# Patient Record
Sex: Male | Born: 2000 | Race: Black or African American | Hispanic: No | Marital: Single | Smoking: Never smoker
Health system: Southern US, Community
[De-identification: ages and names within clinical notes are randomized; demographics above are authoritative.]

## PROBLEM LIST (undated history)

## (undated) ENCOUNTER — Ambulatory Visit (HOSPITAL_COMMUNITY): Admission: EM | Source: Home / Self Care

---
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
## 2023-07-28 ENCOUNTER — Ambulatory Visit (HOSPITAL_COMMUNITY)
Admission: EM | Admit: 2023-07-28 | Discharge: 2023-07-28 | Disposition: A | Discharge status: Home / Self Care | Payer: Commercial Managed Care - PPO | Attending: Family Medicine | Admitting: Family Medicine

## 2023-07-28 ENCOUNTER — Encounter (HOSPITAL_COMMUNITY): Payer: Self-pay

## 2023-07-28 DIAGNOSIS — R1030 Lower abdominal pain, unspecified: Secondary | ICD-10-CM | POA: Diagnosis not present
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
## 2023-08-26 ENCOUNTER — Ambulatory Visit (INDEPENDENT_AMBULATORY_CARE_PROVIDER_SITE_OTHER): Payer: Commercial Managed Care - PPO | Admitting: Family Medicine

## 2023-08-26 ENCOUNTER — Encounter: Payer: Self-pay | Admitting: Family Medicine

## 2023-08-26 VITALS — BP 122/84 | HR 92 | Temp 98.5°F | Ht 76.0 in | Wt 257.4 lb

## 2023-08-26 DIAGNOSIS — N182 Chronic kidney disease, stage 2 (mild): Secondary | ICD-10-CM

## 2023-08-26 DIAGNOSIS — Z0000 Encounter for general adult medical examination without abnormal findings: Secondary | ICD-10-CM | POA: Diagnosis not present

## 2023-08-26 DIAGNOSIS — Z23 Encounter for immunization: Secondary | ICD-10-CM | POA: Insufficient documentation

## 2023-08-26 DIAGNOSIS — Z0001 Encounter for general adult medical examination with abnormal findings: Secondary | ICD-10-CM

## 2023-08-26 DIAGNOSIS — K760 Fatty (change of) liver, not elsewhere classified: Secondary | ICD-10-CM

## 2023-08-26 NOTE — Progress Notes (Addendum)
 Complete physical exam  Assessment & Plan:    Routine Health Maintenance and Physical Exam Discussed health benefits of physical activity, and encouraged him to engage in regular exercise appropriate for his age and condition.  Preventative health care -     CBC with Differential/Platelet -     Lipid panel -     TSH -     COMPLETE METABOLIC PANEL WITHOUT GFR  Need for diphtheria-tetanus-pertussis (Tdap) vaccine -     Tdap vaccine greater than or equal to 7yo IM  Need for influenza vaccination -     Flu vaccine trivalent PF, 6mos and older(Flulaval,Afluria,Fluarix,Fluzone)  Fatty liver  CKD (chronic kidney disease) stage 2, GFR 60-89 ml/min   Addendum- pt did see Nephrology Dr. Carrolyn Clan re CKD stage 2, proteinuria, microcytosis and fatty liver- pt had renal ultrasound which showed mild bilateral hydronephrosis- per Nephrology patient will see Urology re this. Hemoglobin Electrophoresis was also ordered due to microcytosis.  Nephrology also recommended vaccinations for Hep A and Hep B in the future.  Return if symptoms worsen or fail to improve.        Subjective:  Patient ID: Joseph Lara, male    DOB: 20-Jul-2000  Age: 23 y.o. MRN: [id number] Chief Complaint  Patient presents with   Annual Exam    Joseph Lara is a 23 y.o. male who presents today for a complete physical exam.  Patient states he has gained some weight in the last couple years.  Patient has been eating a lot of fast foods.  Patient has not had a physical in a long time. Tetanus due soon Flu shot- not yet STI risk- does not have concerns.  Health Maintenance  Topic Date Due   HPV Vaccine (1 - Male 3-dose series) Never done   HIV Screening  Never done   COVID-19 Vaccine (1 - 2024-25 season) Never done   Flu Shot  02/10/2024   DTaP/Tdap/Td vaccine (8 - Td or Tdap) 08/25/2033   Pneumococcal Vaccination  Completed   Hepatitis C Screening  Completed   Meningitis B Vaccine  Completed    Most recent  fall risk assessment:    08/26/2023    8:18 AM  Fall Risk   Falls in the past year? 0  Number falls in past yr: 0  Injury with Fall? 0     Most recent depression screenings:    08/26/2023    8:18 AM 10/15/2019    2:17 PM  PHQ 2/9 Scores  PHQ - 2 Score 0 0  PHQ- 9 Score  0      The ASCVD Risk score (Arnett DK, et al., 2019) failed to calculate for the following reasons:   The 2019 ASCVD risk score is only valid for ages 79 to 20  History reviewed. No pertinent surgical history. Social History   Tobacco Use   Smoking status: Never   Smokeless tobacco: Never  Vaping Use   Vaping status: Never Used  Substance Use Topics   Alcohol use: No    Alcohol/week: 0.0 standard drinks of alcohol   Drug use: Never   Social History   Socioeconomic History   Marital status: Single    Spouse name: Not on file   Number of children: Not on file   Years of education: Not on file   Highest education level: Not on file  Occupational History   Not on file  Tobacco Use   Smoking status: Never   Smokeless tobacco: Never  Vaping Use  Vaping status: Never Used  Substance and Sexual Activity   Alcohol use: No    Alcohol/week: 0.0 standard drinks of alcohol   Drug use: Never   Sexual activity: Yes    Partners: Female  Other Topics Concern   Not on file  Social History Narrative   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 Pt eats out about 50% of the time. Weight lifting and cardio Engineer, materials)   Social Drivers of Health   Financial Resource Strain: Not on file  Food Insecurity: Not on file  Transportation Needs: Not on file  Physical Activity: Not on file  Stress: Not on file  Social Connections: Not on file  Intimate Partner Violence: Not on file   Family History  Problem Relation Age of Onset   Breast cancer Paternal Aunt    Allergies  Allergen Reactions   Penicillins Rash     Patient Care Team: Amadeo June, MD as PCP -  General (Family Medicine)   No outpatient medications prior to visit.   No facility-administered medications prior to visit.    ROS     Objective:    BP 122/84   Pulse 92   Temp 98.5 F (36.9 C)   Ht 6\' 4"  (1.93 m)   Wt 257 lb 6 oz (116.7 kg)   SpO2 99%   BMI 31.33 kg/m  BP Readings from Last 3 Encounters:  08/26/23 122/84  07/28/23 124/78  01/01/21 (!) 132/82 (82%, Z = 0.92 /  87%, Z = 1.13)*   *BP percentiles are based on the 2017 AAP Clinical Practice Guideline for boys   Wt Readings from Last 3 Encounters:  08/26/23 257 lb 6 oz (116.7 kg)  07/28/23 240 lb (108.9 kg)  01/01/21 194 lb 6.4 oz (88.2 kg) (93%, Z= 1.48)*   * Growth percentiles are based on CDC (Boys, 2-20 Years) data.    Physical Exam Vitals and nursing note reviewed.  Constitutional:      Appearance: Normal appearance.  HENT:     Head: Normocephalic.     Right Ear: Tympanic membrane, ear canal and external ear normal.     Left Ear: Tympanic membrane, ear canal and external ear normal.     Nose: Nose normal.     Mouth/Throat:     Mouth: Mucous membranes are moist.     Pharynx: Oropharynx is clear.  Eyes:     Extraocular Movements: Extraocular movements intact.     Conjunctiva/sclera: Conjunctivae normal.     Pupils: Pupils are equal, round, and reactive to light.  Neck:     Thyroid: No thyroid mass, thyromegaly or thyroid tenderness.  Cardiovascular:     Rate and Rhythm: Normal rate and regular rhythm.     Heart sounds: Normal heart sounds.  Pulmonary:     Effort: Pulmonary effort is normal.     Breath sounds: Normal breath sounds.  Abdominal:     General: Bowel sounds are normal.     Palpations: Abdomen is soft.     Tenderness: There is no abdominal tenderness. There is no guarding.     Hernia: No hernia is present. There is no hernia in the left inguinal area or right inguinal area.  Genitourinary:    Penis: Normal. No tenderness, discharge or lesions.      Testes: Normal.      Epididymis:     Right: Normal.     Left: Normal.  Musculoskeletal:  General: No tenderness. Normal range of motion.     Cervical back: Normal range of motion.     Right lower leg: No edema.     Left lower leg: No edema.  Skin:    General: Skin is warm and dry.  Neurological:     General: No focal deficit present.     Mental Status: He is alert and oriented to person, place, and time. Mental status is at baseline.     Cranial Nerves: No cranial nerve deficit.     Sensory: No sensory deficit.     Motor: No weakness.     Coordination: Coordination normal.     Gait: Gait normal.     Deep Tendon Reflexes: Reflexes normal.  Psychiatric:        Mood and Affect: Mood normal.        Behavior: Behavior normal.        Thought Content: Thought content normal.        Judgment: Judgment normal.      Results for orders placed or performed in visit on 08/26/23  CBC with Differential/Platelet  Result Value Ref Range   WBC 6.3 3.8 - 10.8 Thousand/uL   RBC 6.49 (H) 4.20 - 5.80 Million/uL   Hemoglobin 14.1 13.2 - 17.1 g/dL   HCT 40.9 81.1 - 91.4 %   MCV 74.3 (L) 80.0 - 100.0 fL   MCH 21.7 (L) 27.0 - 33.0 pg   MCHC 29.3 (L) 32.0 - 36.0 g/dL   RDW 78.2 95.6 - 21.3 %   Platelets 253 140 - 400 Thousand/uL   MPV 10.1 7.5 - 12.5 fL   Neutro Abs 3,849 1,500 - 7,800 cells/uL   Absolute Lymphocytes 1,449 850 - 3,900 cells/uL   Absolute Monocytes 832 200 - 950 cells/uL   Eosinophils Absolute 139 15 - 500 cells/uL   Basophils Absolute 32 0 - 200 cells/uL   Neutrophils Relative % 61.1 %   Total Lymphocyte 23.0 %   Monocytes Relative 13.2 %   Eosinophils Relative 2.2 %   Basophils Relative 0.5 %  Lipid panel  Result Value Ref Range   Cholesterol 117 <200 mg/dL   HDL 48 > OR = 40 mg/dL   Triglycerides 64 <086 mg/dL   LDL Cholesterol (Calc) 55 mg/dL (calc)   Total CHOL/HDL Ratio 2.4 <5.0 (calc)   Non-HDL Cholesterol (Calc) 69 <578 mg/dL (calc)  TSH  Result Value Ref Range   TSH 2.54  0.40 - 4.50 mIU/L  COMPLETE METABOLIC PANEL WITH GFR  Result Value Ref Range   Glucose, Bld 97 65 - 99 mg/dL   BUN 16 7 - 25 mg/dL   Creat 4.69 (H) 6.29 - 1.24 mg/dL   eGFR 79 > OR = 60 BM/WUX/3.24M0   BUN/Creatinine Ratio 12 6 - 22 (calc)   Sodium 139 135 - 146 mmol/L   Potassium 4.3 3.5 - 5.3 mmol/L   Chloride 103 98 - 110 mmol/L   CO2 24 20 - 32 mmol/L   Calcium 9.4 8.6 - 10.3 mg/dL   Total Protein 7.2 6.1 - 8.1 g/dL   Albumin 4.7 3.6 - 5.1 g/dL   Globulin 2.5 1.9 - 3.7 g/dL (calc)   AG Ratio 1.9 1.0 - 2.5 (calc)   Total Bilirubin 0.7 0.2 - 1.2 mg/dL   Alkaline phosphatase (APISO) 64 36 - 130 U/L   AST 43 (H) 10 - 40 U/L   ALT 52 (H) 9 - 46 U/L  Amadeo June, MD

## 2023-08-27 LAB — CBC WITH DIFFERENTIAL/PLATELET
Absolute Lymphocytes: 1449 {cells}/uL (ref 850–3900)
Absolute Monocytes: 832 {cells}/uL (ref 200–950)
Basophils Absolute: 32 {cells}/uL (ref 0–200)
Basophils Relative: 0.5 %
Eosinophils Absolute: 139 {cells}/uL (ref 15–500)
Eosinophils Relative: 2.2 %
HCT: 48.2 % (ref 38.5–50.0)
Hemoglobin: 14.1 g/dL (ref 13.2–17.1)
MCH: 21.7 pg — ABNORMAL LOW (ref 27.0–33.0)
MCHC: 29.3 g/dL — ABNORMAL LOW (ref 32.0–36.0)
MCV: 74.3 fL — ABNORMAL LOW (ref 80.0–100.0)
MPV: 10.1 fL (ref 7.5–12.5)
Monocytes Relative: 13.2 %
Neutro Abs: 3849 {cells}/uL (ref 1500–7800)
Neutrophils Relative %: 61.1 %
Platelets: 253 10*3/uL (ref 140–400)
RBC: 6.49 10*6/uL — ABNORMAL HIGH (ref 4.20–5.80)
RDW: 14.4 % (ref 11.0–15.0)
Total Lymphocyte: 23 %
WBC: 6.3 10*3/uL (ref 3.8–10.8)

## 2023-08-27 LAB — COMPLETE METABOLIC PANEL WITH GFR
AG Ratio: 1.9 (calc) (ref 1.0–2.5)
ALT: 52 U/L — ABNORMAL HIGH (ref 9–46)
AST: 43 U/L — ABNORMAL HIGH (ref 10–40)
Albumin: 4.7 g/dL (ref 3.6–5.1)
Alkaline phosphatase (APISO): 64 U/L (ref 36–130)
BUN/Creatinine Ratio: 12 (calc) (ref 6–22)
BUN: 16 mg/dL (ref 7–25)
CO2: 24 mmol/L (ref 20–32)
Calcium: 9.4 mg/dL (ref 8.6–10.3)
Chloride: 103 mmol/L (ref 98–110)
Creat: 1.32 mg/dL — ABNORMAL HIGH (ref 0.60–1.24)
Globulin: 2.5 g/dL (ref 1.9–3.7)
Glucose, Bld: 97 mg/dL (ref 65–99)
Potassium: 4.3 mmol/L (ref 3.5–5.3)
Sodium: 139 mmol/L (ref 135–146)
Total Bilirubin: 0.7 mg/dL (ref 0.2–1.2)
Total Protein: 7.2 g/dL (ref 6.1–8.1)
eGFR: 79 mL/min/{1.73_m2} (ref >=60)

## 2023-08-27 LAB — LIPID PANEL
Cholesterol: 117 mg/dL (ref <200)
HDL: 48 mg/dL (ref >=40)
LDL Cholesterol (Calc): 55 mg/dL
Non-HDL Cholesterol (Calc): 69 mg/dL (ref <130)
Total CHOL/HDL Ratio: 2.4 (calc) (ref <5.0)
Triglycerides: 64 mg/dL (ref <150)

## 2023-08-27 LAB — TSH: TSH: 2.54 m[IU]/L (ref 0.40–4.50)

## 2023-09-06 ENCOUNTER — Other Ambulatory Visit: Payer: Commercial Managed Care - PPO

## 2023-09-06 DIAGNOSIS — R7989 Other specified abnormal findings of blood chemistry: Secondary | ICD-10-CM

## 2023-09-06 DIAGNOSIS — N289 Disorder of kidney and ureter, unspecified: Secondary | ICD-10-CM

## 2023-09-07 LAB — COMPLETE METABOLIC PANEL WITH GFR
AG Ratio: 1.6 (calc) (ref 1.0–2.5)
ALT: 36 U/L (ref 9–46)
AST: 21 U/L (ref 10–40)
Albumin: 4.2 g/dL (ref 3.6–5.1)
Alkaline phosphatase (APISO): 60 U/L (ref 36–130)
BUN/Creatinine Ratio: 13 (calc) (ref 6–22)
BUN: 17 mg/dL (ref 7–25)
CO2: 25 mmol/L (ref 20–32)
Calcium: 9.3 mg/dL (ref 8.6–10.3)
Chloride: 104 mmol/L (ref 98–110)
Creat: 1.33 mg/dL — ABNORMAL HIGH (ref 0.60–1.24)
Globulin: 2.7 g/dL (ref 1.9–3.7)
Glucose, Bld: 100 mg/dL — ABNORMAL HIGH (ref 65–99)
Potassium: 4.4 mmol/L (ref 3.5–5.3)
Sodium: 139 mmol/L (ref 135–146)
Total Bilirubin: 0.4 mg/dL (ref 0.2–1.2)
Total Protein: 6.9 g/dL (ref 6.1–8.1)
eGFR: 78 mL/min/{1.73_m2} (ref >=60)

## 2023-09-07 LAB — HEPATIC FUNCTION PANEL
AG Ratio: 1.6 (calc) (ref 1.0–2.5)
ALT: 36 U/L (ref 9–46)
AST: 21 U/L (ref 10–40)
Albumin: 4.2 g/dL (ref 3.6–5.1)
Alkaline phosphatase (APISO): 60 U/L (ref 36–130)
Bilirubin, Direct: 0.1 mg/dL (ref 0.0–0.2)
Globulin: 2.7 g/dL (ref 1.9–3.7)
Indirect Bilirubin: 0.3 mg/dL (ref 0.2–1.2)
Total Bilirubin: 0.4 mg/dL (ref 0.2–1.2)
Total Protein: 6.9 g/dL (ref 6.1–8.1)

## 2023-09-07 LAB — HEPATITIS PANEL, ACUTE
Hep A IgM: NONREACTIVE
Hep B C IgM: NONREACTIVE
Hepatitis B Surface Ag: NONREACTIVE
Hepatitis C Ab: NONREACTIVE

## 2023-09-08 ENCOUNTER — Other Ambulatory Visit: Payer: Self-pay

## 2023-09-08 DIAGNOSIS — N289 Disorder of kidney and ureter, unspecified: Secondary | ICD-10-CM

## 2023-10-13 ENCOUNTER — Other Ambulatory Visit (HOSPITAL_COMMUNITY): Payer: Self-pay | Admitting: Nephrology

## 2023-10-13 DIAGNOSIS — Z6831 Body mass index (BMI) 31.0-31.9, adult: Secondary | ICD-10-CM | POA: Diagnosis not present

## 2023-10-13 DIAGNOSIS — R718 Other abnormality of red blood cells: Secondary | ICD-10-CM | POA: Diagnosis not present

## 2023-10-13 DIAGNOSIS — R944 Abnormal results of kidney function studies: Secondary | ICD-10-CM | POA: Diagnosis not present

## 2023-10-19 ENCOUNTER — Ambulatory Visit (HOSPITAL_BASED_OUTPATIENT_CLINIC_OR_DEPARTMENT_OTHER)
Admission: RE | Admit: 2023-10-19 | Discharge: 2023-10-19 | Disposition: A | Discharge status: Home / Self Care | Source: Ambulatory Visit | Attending: Nephrology | Admitting: Nephrology

## 2023-10-19 DIAGNOSIS — R944 Abnormal results of kidney function studies: Secondary | ICD-10-CM | POA: Insufficient documentation

## 2023-10-19 DIAGNOSIS — R718 Other abnormality of red blood cells: Secondary | ICD-10-CM | POA: Diagnosis not present

## 2023-10-19 DIAGNOSIS — N1330 Unspecified hydronephrosis: Secondary | ICD-10-CM | POA: Diagnosis not present

## 2023-10-26 DIAGNOSIS — N1330 Unspecified hydronephrosis: Secondary | ICD-10-CM | POA: Diagnosis not present

## 2023-10-26 DIAGNOSIS — R944 Abnormal results of kidney function studies: Secondary | ICD-10-CM | POA: Diagnosis not present

## 2023-10-26 DIAGNOSIS — K760 Fatty (change of) liver, not elsewhere classified: Secondary | ICD-10-CM | POA: Diagnosis not present

## 2023-10-26 DIAGNOSIS — R718 Other abnormality of red blood cells: Secondary | ICD-10-CM | POA: Diagnosis not present

## 2023-11-09 DIAGNOSIS — K760 Fatty (change of) liver, not elsewhere classified: Secondary | ICD-10-CM | POA: Insufficient documentation

## 2023-11-09 DIAGNOSIS — N182 Chronic kidney disease, stage 2 (mild): Secondary | ICD-10-CM | POA: Insufficient documentation

## 2024-04-19 ENCOUNTER — Encounter (HOSPITAL_BASED_OUTPATIENT_CLINIC_OR_DEPARTMENT_OTHER): Payer: Self-pay

## 2024-04-19 ENCOUNTER — Other Ambulatory Visit: Payer: Self-pay

## 2024-04-19 ENCOUNTER — Emergency Department (HOSPITAL_BASED_OUTPATIENT_CLINIC_OR_DEPARTMENT_OTHER)
Admission: EM | Admit: 2024-04-19 | Discharge: 2024-04-19 | Disposition: A | Discharge status: Home / Self Care | Payer: Worker's Compensation | Attending: Emergency Medicine | Admitting: Emergency Medicine

## 2024-04-19 ENCOUNTER — Other Ambulatory Visit (HOSPITAL_BASED_OUTPATIENT_CLINIC_OR_DEPARTMENT_OTHER): Payer: Self-pay

## 2024-04-19 DIAGNOSIS — T23062A Burn of unspecified degree of back of left hand, initial encounter: Secondary | ICD-10-CM | POA: Diagnosis present

## 2024-04-19 DIAGNOSIS — X19XXXA Contact with other heat and hot substances, initial encounter: Secondary | ICD-10-CM | POA: Insufficient documentation

## 2024-04-19 DIAGNOSIS — T23202A Burn of second degree of left hand, unspecified site, initial encounter: Secondary | ICD-10-CM | POA: Diagnosis not present

## 2024-04-19 DIAGNOSIS — T23262A Burn of second degree of back of left hand, initial encounter: Secondary | ICD-10-CM | POA: Diagnosis not present

## 2024-04-19 DIAGNOSIS — T310 Burns involving less than 10% of body surface: Secondary | ICD-10-CM | POA: Diagnosis not present

## 2024-04-19 DIAGNOSIS — Y990 Civilian activity done for income or pay: Secondary | ICD-10-CM | POA: Diagnosis not present

## 2024-04-19 DIAGNOSIS — T300 Burn of unspecified body region, unspecified degree: Secondary | ICD-10-CM

## 2024-04-19 MED ORDER — DOXYCYCLINE HYCLATE 100 MG PO CAPS
100.0000 mg | ORAL_CAPSULE | Freq: Two times a day (BID) | ORAL | 0 refills | Status: AC
  Filled 2024-04-19: qty 20, 10d supply, fill #0

## 2024-04-19 MED ORDER — TETANUS-DIPHTH-ACELL PERTUSSIS 5-2-15.5 LF-MCG/0.5 IM SUSP
0.5000 mL | Freq: Once | INTRAMUSCULAR | Status: DC
  Filled 2024-04-19: qty 0.5

## 2024-04-19 MED ORDER — BACITRACIN 500 UNIT/GM EX OINT
1.0000 | TOPICAL_OINTMENT | Freq: Two times a day (BID) | CUTANEOUS | 0 refills | Status: AC
  Filled 2024-04-19: qty 28, 30d supply, fill #0

## 2024-04-19 NOTE — Discharge Instructions (Addendum)
 Recommend bacitracin ointment or Neosporin ointment twice daily to the wound for the next several days until improved.  You can buy this over-the-counter but I have also sent in a prescription whichever 1 is cheaper.  Have also prescribed you oral antibiotic to help prevent infection as well.  Continue soap and water and gentle washing of this area twice a day.  Pat dry and cover as I showed you today.
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
## 2024-04-30 ENCOUNTER — Other Ambulatory Visit (HOSPITAL_BASED_OUTPATIENT_CLINIC_OR_DEPARTMENT_OTHER): Payer: Self-pay

## 2024-08-27 ENCOUNTER — Encounter: Payer: Commercial Managed Care - PPO | Admitting: Family Medicine
